# Patient Record
Sex: Female | Born: 1935 | Race: White | Hispanic: No | Smoking: Former smoker
Health system: Southern US, Community
[De-identification: ages and names within clinical notes are randomized; demographics above are authoritative.]

## PROBLEM LIST (undated history)

## (undated) DIAGNOSIS — H3530 Unspecified macular degeneration: Secondary | ICD-10-CM

## (undated) DIAGNOSIS — H409 Unspecified glaucoma: Secondary | ICD-10-CM

## (undated) DIAGNOSIS — E039 Hypothyroidism, unspecified: Secondary | ICD-10-CM

## (undated) DIAGNOSIS — M1990 Unspecified osteoarthritis, unspecified site: Secondary | ICD-10-CM

## (undated) DIAGNOSIS — I2510 Atherosclerotic heart disease of native coronary artery without angina pectoris: Secondary | ICD-10-CM

## (undated) DIAGNOSIS — Z87898 Personal history of other specified conditions: Secondary | ICD-10-CM

## (undated) HISTORY — PX: HEEL SPUR SURGERY: SHX665

## (undated) HISTORY — PX: ABDOMINAL HYSTERECTOMY: SHX81

## (undated) HISTORY — PX: EYE SURGERY: SHX253

## (undated) HISTORY — PX: HAMMER TOE SURGERY: SHX385

## (undated) HISTORY — PX: SHOULDER ARTHROSCOPY W/ ROTATOR CUFF REPAIR: SHX2400

---

## 2002-08-11 HISTORY — PX: STAPEDECTOMY: SHX2435

## 2005-03-04 ENCOUNTER — Ambulatory Visit: Payer: Self-pay | Admitting: Internal Medicine

## 2006-03-11 ENCOUNTER — Ambulatory Visit: Payer: Self-pay | Admitting: Internal Medicine

## 2006-11-14 ENCOUNTER — Other Ambulatory Visit: Payer: Self-pay

## 2006-11-14 ENCOUNTER — Emergency Department: Payer: Self-pay | Admitting: Emergency Medicine

## 2007-04-14 ENCOUNTER — Ambulatory Visit: Payer: Self-pay | Admitting: Internal Medicine

## 2008-06-06 ENCOUNTER — Ambulatory Visit: Payer: Self-pay | Admitting: Internal Medicine

## 2009-08-23 ENCOUNTER — Ambulatory Visit: Payer: Self-pay | Admitting: Internal Medicine

## 2010-10-01 ENCOUNTER — Ambulatory Visit: Payer: Self-pay | Admitting: Internal Medicine

## 2011-10-22 ENCOUNTER — Ambulatory Visit: Payer: Self-pay | Admitting: Internal Medicine

## 2012-03-02 ENCOUNTER — Ambulatory Visit: Payer: Self-pay | Admitting: Orthopedic Surgery

## 2012-05-18 ENCOUNTER — Encounter: Payer: Self-pay | Admitting: Orthopedic Surgery

## 2012-06-11 ENCOUNTER — Encounter: Payer: Self-pay | Admitting: Orthopedic Surgery

## 2012-07-11 ENCOUNTER — Encounter: Payer: Self-pay | Admitting: Orthopedic Surgery

## 2012-10-26 ENCOUNTER — Ambulatory Visit: Payer: Self-pay | Admitting: Internal Medicine

## 2013-01-26 ENCOUNTER — Ambulatory Visit: Payer: Self-pay | Admitting: Cardiology

## 2013-10-27 ENCOUNTER — Ambulatory Visit: Payer: Self-pay | Admitting: Family Medicine

## 2014-12-05 ENCOUNTER — Other Ambulatory Visit: Payer: Self-pay

## 2014-12-05 DIAGNOSIS — Z1231 Encounter for screening mammogram for malignant neoplasm of breast: Secondary | ICD-10-CM

## 2014-12-15 ENCOUNTER — Other Ambulatory Visit: Payer: Self-pay

## 2014-12-15 ENCOUNTER — Ambulatory Visit
Admission: RE | Admit: 2014-12-15 | Discharge: 2014-12-15 | Disposition: A | Discharge status: Home / Self Care | Payer: Medicare Other | Source: Ambulatory Visit | Attending: Family Medicine | Admitting: Family Medicine

## 2014-12-15 DIAGNOSIS — Z1231 Encounter for screening mammogram for malignant neoplasm of breast: Secondary | ICD-10-CM | POA: Insufficient documentation

## 2015-10-17 ENCOUNTER — Other Ambulatory Visit: Payer: Self-pay | Admitting: Student

## 2015-10-17 DIAGNOSIS — L02413 Cutaneous abscess of right upper limb: Secondary | ICD-10-CM

## 2015-11-06 ENCOUNTER — Ambulatory Visit
Admission: RE | Admit: 2015-11-06 | Discharge: 2015-11-06 | Disposition: A | Discharge status: Home / Self Care | Payer: Medicare Other | Source: Ambulatory Visit | Attending: Student | Admitting: Student

## 2015-11-06 DIAGNOSIS — Z9889 Other specified postprocedural states: Secondary | ICD-10-CM | POA: Insufficient documentation

## 2015-11-06 DIAGNOSIS — L02413 Cutaneous abscess of right upper limb: Secondary | ICD-10-CM | POA: Diagnosis not present

## 2015-11-06 DIAGNOSIS — M7989 Other specified soft tissue disorders: Secondary | ICD-10-CM | POA: Diagnosis not present

## 2015-11-14 ENCOUNTER — Other Ambulatory Visit: Payer: Medicare Other

## 2015-11-15 ENCOUNTER — Encounter: Admission: RE | Payer: Self-pay | Source: Ambulatory Visit

## 2015-11-15 ENCOUNTER — Ambulatory Visit: Admission: RE | Admit: 2015-11-15 | Payer: Medicare Other | Source: Ambulatory Visit | Admitting: Surgery

## 2015-11-15 SURGERY — REPAIR, ROTATOR CUFF, OPEN
Anesthesia: Choice | Laterality: Right

## 2015-11-28 ENCOUNTER — Other Ambulatory Visit: Payer: Self-pay | Admitting: Family Medicine

## 2015-11-28 DIAGNOSIS — Z1231 Encounter for screening mammogram for malignant neoplasm of breast: Secondary | ICD-10-CM

## 2015-11-30 ENCOUNTER — Encounter
Admission: RE | Admit: 2015-11-30 | Discharge: 2015-11-30 | Disposition: A | Discharge status: Home / Self Care | Payer: Medicare Other | Source: Ambulatory Visit | Attending: Surgery | Admitting: Surgery

## 2015-11-30 DIAGNOSIS — Z01810 Encounter for preprocedural cardiovascular examination: Secondary | ICD-10-CM | POA: Diagnosis present

## 2015-11-30 DIAGNOSIS — Z01812 Encounter for preprocedural laboratory examination: Secondary | ICD-10-CM | POA: Diagnosis present

## 2015-11-30 HISTORY — DX: Unspecified osteoarthritis, unspecified site: M19.90

## 2015-11-30 HISTORY — DX: Hypothyroidism, unspecified: E03.9

## 2015-11-30 HISTORY — DX: Personal history of other specified conditions: Z87.898

## 2015-11-30 HISTORY — DX: Unspecified glaucoma: H40.9

## 2015-11-30 HISTORY — DX: Unspecified macular degeneration: H35.30

## 2015-11-30 HISTORY — DX: Atherosclerotic heart disease of native coronary artery without angina pectoris: I25.10

## 2015-11-30 LAB — BASIC METABOLIC PANEL
Anion gap: 6 (ref 5–15)
BUN: 17 mg/dL (ref 6–20)
CO2: 29 mmol/L (ref 22–32)
Calcium: 9 mg/dL (ref 8.9–10.3)
Chloride: 104 mmol/L (ref 101–111)
Creatinine, Ser: 0.75 mg/dL (ref 0.44–1.00)
GFR calc Af Amer: >60 mL/min (ref >60)
GFR calc non Af Amer: >60 mL/min (ref >60)
Glucose, Bld: 82 mg/dL (ref 65–99)
Potassium: 3.9 mmol/L (ref 3.5–5.1)
Sodium: 139 mmol/L (ref 135–145)

## 2015-11-30 LAB — CBC
HCT: 37.4 % (ref 35.0–47.0)
Hemoglobin: 12.9 g/dL (ref 12.0–16.0)
MCH: 32 pg (ref 26.0–34.0)
MCHC: 34.5 g/dL (ref 32.0–36.0)
MCV: 92.9 fL (ref 80.0–100.0)
Platelets: 236 10*3/uL (ref 150–440)
RBC: 4.02 MIL/uL (ref 3.80–5.20)
RDW: 13.3 % (ref 11.5–14.5)
WBC: 6.2 10*3/uL (ref 3.6–11.0)

## 2015-11-30 NOTE — Pre-Procedure Instructions (Signed)
Cardiac clearance received from Dr. Darrold JunkerParaschos stating patient is at low risk for surgery.

## 2015-11-30 NOTE — Pre-Procedure Instructions (Signed)
Anesthesia Note   Result Impression   1. Negative ETT 2. Normal left ventricular function 3. Normal wall motion 4. No evidence for scar or ischemia   Result Narrative  Procedure: Exercise Myocardial Perfusion Imaging   ONE day procedure  Indication: Coronary artery disease involving native coronary artery of  native heart with angina pectoris - Plan: NM myocardial perfusion SPECT  multiple (stress and rest), ECG stress test only  Chest pain with high risk for cardiac etiology - Plan: NM myocardial  perfusion SPECT multiple (stress and rest), ECG stress test only Ordering Physician:   Dr. Marcina MillardAlexander Paraschos   Clinical History: [age] y.o. year old female Vitals: Height: 61 in  Weight: 155 lb Cardiac risk factors include:    Hyperlipidemia, HTN and CAD    Procedure: The patient performed treadmill exercise using a Bruce protocol for 3:44  minutes. The exercise test was stopped due to DYSPNEA/fatigue.  Blood  pressure response was normal.   Rest HR: 74bpm Rest BP: 120/1568mmHg Max HR: 137bpm Max BP: 172/10870mmHg Mets:     6.30 % MAX HR:   100%  Stress Test Administered by: Percell BostonINA WALLACE, CMA  ECG Interpretation: Rest ECG:  normal sinus rhythm, none Stress ECG:  normal sinus rhythm,  Recovery ECG:  normal sinus rhythm ECG Interpretation:  negative, no ECG changes.   Administrations This Visit    technetium Tc3359m sestamibi (CARDIOLITE) injection 14.1 millicurie    Administered Action Dose Route Administered By      05/17/2014 Given 14.1 millicurie Intravenous Lennox Soldersebecca Truhe, CNMT            technetium Tc6459m sestamibi (CARDIOLITE) injection 35.2 millicurie    Administered Action Dose Route Administered By      05/17/2014 Given 35.2 millicurie Intravenous Lennox Soldersebecca Truhe, CNMT              Gated post-stress perfusion imaging was performed 30 minutes after stress.  Rest images were performed 30 minutes after injection.  Gated LV Analysis:  TID:     LVEF=74%  FINDINGS: Regional wall motion:  reveals normal myocardial thickening and wall  motion. The overall quality of the study is good.   Artifacts noted: no Left ventricular cavity: normal.  Perfusion Analysis:  SPECT images demonstrate homogeneous tracer  distribution throughout the myocardium.     Status Results Details   Encounter Summary  ECG [facility]02[facility]  ECG stress test only10/02/2014  [facility]  Component Name Value Range       Status Results Details   Encounter Summary

## 2015-11-30 NOTE — Patient Instructions (Signed)
  Your procedure is scheduled on:December 06, 2015 (Thursday) Report to Day Surgery.[facility](Medical Mall) Second Floor To find out your arrival time please call [phone] between 1PM - 3PM on December 05, 2015 (Wednesday).  Remember: Instructions that are not followed completely may result in serious medical risk, up to and including death, or upon the discretion of your surgeon and anesthesiologist your surgery may need to be rescheduled.    __x__ 1. Do not eat food or drink liquids after midnight. No gum chewing or hard candies.     __x__ 2. No Alcohol for 24 hours before or after surgery.   ____ 3. Bring all medications with you on the day of surgery if instructed.    __x__ 4. Notify your doctor if there is any change in your medical condition     (cold, fever, infections).     Do not wear jewelry, make-up, hairpins, clips or nail polish.  Do not wear lotions, powders, or perfumes. You may wear deodorant.  Do not shave 48 hours prior to surgery. Men may shave face and neck.  Do not bring valuables to the hospital.    [facility] is not responsible for any belongings or valuables.               Contacts, dentures or bridgework may not be worn into surgery.  Leave your suitcase in the car. After surgery it may be brought to your room.  For patients admitted to the hospital, discharge time is determined by your                treatment team.   Patients discharged the day of surgery will not be allowed to drive home.   Please read over the following fact sheets that you were given:   Surgical Site Infection Prevention   ____ Take these medicines the morning of surgery with A SIP OF WATER:    1.   2.   3.   4.  5.  6.  ____ Fleet Enema (as directed)   _x___ Use CHG Soap as directed  ____ Use inhalers on the day of surgery  ____ Stop metformin 2 days prior to surgery    ____ Take 1/2 of usual insulin dose the night before surgery and none on the morning of surgery.   __x__ Stop  Coumadin/Plavix/aspirin on (STOP ASPIRIN NOW)  __x__ Stop Anti-inflammatories on (NO NSAIDS)   __x__ Stop supplements until after surgery.  (STOP AREDS 2 NOW)  ____ Bring C-Pap to the hospital.

## 2015-12-06 ENCOUNTER — Encounter
Admission: RE | Disposition: A | Discharge status: Home / Self Care | Payer: Self-pay | Source: Ambulatory Visit | Attending: Surgery

## 2015-12-06 ENCOUNTER — Ambulatory Visit
Admission: RE | Admit: 2015-12-06 | Discharge: 2015-12-06 | Disposition: A | Discharge status: Home / Self Care | Payer: Medicare Other | Source: Ambulatory Visit | Attending: Surgery | Admitting: Surgery

## 2015-12-06 ENCOUNTER — Ambulatory Visit: Payer: Medicare Other | Admitting: Anesthesiology

## 2015-12-06 ENCOUNTER — Encounter: Payer: Self-pay | Admitting: Anesthesiology

## 2015-12-06 DIAGNOSIS — E039 Hypothyroidism, unspecified: Secondary | ICD-10-CM | POA: Diagnosis not present

## 2015-12-06 DIAGNOSIS — J302 Other seasonal allergic rhinitis: Secondary | ICD-10-CM | POA: Diagnosis not present

## 2015-12-06 DIAGNOSIS — E785 Hyperlipidemia, unspecified: Secondary | ICD-10-CM | POA: Diagnosis not present

## 2015-12-06 DIAGNOSIS — I10 Essential (primary) hypertension: Secondary | ICD-10-CM | POA: Diagnosis not present

## 2015-12-06 DIAGNOSIS — Z7982 Long term (current) use of aspirin: Secondary | ICD-10-CM | POA: Diagnosis not present

## 2015-12-06 DIAGNOSIS — Z79899 Other long term (current) drug therapy: Secondary | ICD-10-CM | POA: Diagnosis not present

## 2015-12-06 DIAGNOSIS — I2510 Atherosclerotic heart disease of native coronary artery without angina pectoris: Secondary | ICD-10-CM | POA: Insufficient documentation

## 2015-12-06 DIAGNOSIS — Z9842 Cataract extraction status, left eye: Secondary | ICD-10-CM | POA: Insufficient documentation

## 2015-12-06 DIAGNOSIS — Z8249 Family history of ischemic heart disease and other diseases of the circulatory system: Secondary | ICD-10-CM | POA: Diagnosis not present

## 2015-12-06 DIAGNOSIS — Z87891 Personal history of nicotine dependence: Secondary | ICD-10-CM | POA: Diagnosis not present

## 2015-12-06 DIAGNOSIS — Z9841 Cataract extraction status, right eye: Secondary | ICD-10-CM | POA: Diagnosis not present

## 2015-12-06 DIAGNOSIS — Z90710 Acquired absence of both cervix and uterus: Secondary | ICD-10-CM | POA: Diagnosis not present

## 2015-12-06 DIAGNOSIS — M795 Residual foreign body in soft tissue: Secondary | ICD-10-CM | POA: Diagnosis not present

## 2015-12-06 DIAGNOSIS — Z791 Long term (current) use of non-steroidal anti-inflammatories (NSAID): Secondary | ICD-10-CM | POA: Insufficient documentation

## 2015-12-06 DIAGNOSIS — L02413 Cutaneous abscess of right upper limb: Secondary | ICD-10-CM | POA: Insufficient documentation

## 2015-12-06 DIAGNOSIS — Z820 Family history of epilepsy and other diseases of the nervous system: Secondary | ICD-10-CM | POA: Insufficient documentation

## 2015-12-06 DIAGNOSIS — Z9889 Other specified postprocedural states: Secondary | ICD-10-CM | POA: Diagnosis not present

## 2015-12-06 DIAGNOSIS — Z888 Allergy status to other drugs, medicaments and biological substances status: Secondary | ICD-10-CM | POA: Diagnosis not present

## 2015-12-06 DIAGNOSIS — H409 Unspecified glaucoma: Secondary | ICD-10-CM | POA: Insufficient documentation

## 2015-12-06 HISTORY — PX: SHOULDER OPEN ROTATOR CUFF REPAIR: SHX2407

## 2015-12-06 HISTORY — PX: IRRIGATION AND DEBRIDEMENT SHOULDER: SHX5880

## 2015-12-06 SURGERY — REPAIR, ROTATOR CUFF, OPEN
Anesthesia: General | Site: Shoulder | Laterality: Right | Wound class: Contaminated

## 2015-12-06 MED ORDER — PHENYLEPHRINE HCL 10 MG/ML IJ SOLN
INTRAMUSCULAR | Status: DC | PRN
  Administered 2015-12-06 (×2): 100 ug via INTRAVENOUS
  Administered 2015-12-06: 200 ug via INTRAVENOUS
  Administered 2015-12-06: 100 ug via INTRAVENOUS
  Administered 2015-12-06: 200 ug via INTRAVENOUS

## 2015-12-06 MED ORDER — LACTATED RINGERS IV SOLN
INTRAVENOUS | Status: DC
  Administered 2015-12-06: 07:00:00 via INTRAVENOUS

## 2015-12-06 MED ORDER — FENTANYL CITRATE (PF) 100 MCG/2ML IJ SOLN
INTRAMUSCULAR | Status: DC | PRN
  Administered 2015-12-06: 100 ug via INTRAVENOUS

## 2015-12-06 MED ORDER — HYDROCODONE-ACETAMINOPHEN 5-325 MG PO TABS: 1.0000 | ORAL_TABLET | Freq: Four times a day (QID) | ORAL | Status: DC | PRN

## 2015-12-06 MED ORDER — OXYCODONE HCL 5 MG/5ML PO SOLN: 5.0000 mg | Freq: Once | ORAL | Status: DC | PRN

## 2015-12-06 MED ORDER — OXYCODONE HCL 5 MG PO TABS: 5.0000 mg | ORAL_TABLET | Freq: Once | ORAL | Status: DC | PRN

## 2015-12-06 MED ORDER — BUPIVACAINE-EPINEPHRINE (PF) 0.5% -1:200000 IJ SOLN
INTRAMUSCULAR | Status: AC
  Filled 2015-12-06: qty 30

## 2015-12-06 MED ORDER — SUGAMMADEX SODIUM 200 MG/2ML IV SOLN
INTRAVENOUS | Status: DC | PRN
  Administered 2015-12-06: 129.8 mg via INTRAVENOUS

## 2015-12-06 MED ORDER — LIDOCAINE HCL (CARDIAC) 20 MG/ML IV SOLN
INTRAVENOUS | Status: DC | PRN
  Administered 2015-12-06: 60 mg via INTRAVENOUS

## 2015-12-06 MED ORDER — ACETAMINOPHEN 10 MG/ML IV SOLN
INTRAVENOUS | Status: DC | PRN
  Administered 2015-12-06: 1000 mg via INTRAVENOUS

## 2015-12-06 MED ORDER — DEXAMETHASONE SODIUM PHOSPHATE 10 MG/ML IJ SOLN
INTRAMUSCULAR | Status: DC | PRN
  Administered 2015-12-06: 4 mg via INTRAVENOUS

## 2015-12-06 MED ORDER — PROPOFOL 10 MG/ML IV BOLUS
INTRAVENOUS | Status: DC | PRN
  Administered 2015-12-06: 90 mg via INTRAVENOUS

## 2015-12-06 MED ORDER — GLYCOPYRROLATE 0.2 MG/ML IJ SOLN
INTRAMUSCULAR | Status: DC | PRN
  Administered 2015-12-06: 0.2 mg via INTRAVENOUS

## 2015-12-06 MED ORDER — CEFAZOLIN SODIUM-DEXTROSE 2-4 GM/100ML-% IV SOLN
INTRAVENOUS | Status: DC
Start: 2015-12-06 — End: 2015-12-06
  Filled 2015-12-06: qty 100

## 2015-12-06 MED ORDER — ACETAMINOPHEN 10 MG/ML IV SOLN
INTRAVENOUS | Status: AC
  Filled 2015-12-06: qty 100

## 2015-12-06 MED ORDER — CEFAZOLIN SODIUM-DEXTROSE 2-4 GM/100ML-% IV SOLN
2.0000 g | Freq: Once | INTRAVENOUS | Status: AC
  Administered 2015-12-06: 2 g via INTRAVENOUS

## 2015-12-06 MED ORDER — FAMOTIDINE 20 MG PO TABS
20.0000 mg | ORAL_TABLET | Freq: Once | ORAL | Status: AC
  Administered 2015-12-06: 20 mg via ORAL

## 2015-12-06 MED ORDER — BUPIVACAINE-EPINEPHRINE (PF) 0.5% -1:200000 IJ SOLN
INTRAMUSCULAR | Status: DC | PRN
  Administered 2015-12-06: 30 mL

## 2015-12-06 MED ORDER — ROCURONIUM BROMIDE 100 MG/10ML IV SOLN
INTRAVENOUS | Status: DC | PRN
  Administered 2015-12-06: 30 mg via INTRAVENOUS

## 2015-12-06 MED ORDER — FAMOTIDINE 20 MG PO TABS
ORAL_TABLET | ORAL | Status: AC
  Filled 2015-12-06: qty 1

## 2015-12-06 MED ORDER — ONDANSETRON HCL 4 MG/2ML IJ SOLN
INTRAMUSCULAR | Status: DC | PRN
  Administered 2015-12-06: 4 mg via INTRAVENOUS

## 2015-12-06 MED ORDER — FENTANYL CITRATE (PF) 100 MCG/2ML IJ SOLN: 25.0000 ug | INTRAMUSCULAR | Status: DC | PRN

## 2015-12-06 SURGICAL SUPPLY — 50 items
BIT DRILL JUGRKNT W/NDL BIT2.9 (DRILL) IMPLANT
BLADE FULL RADIUS 3.5 (BLADE) IMPLANT
BLADE SHAVER 4.5X7 STR FR (MISCELLANEOUS) IMPLANT
BUR ACROMIONIZER 4.0 (BURR) IMPLANT
BUR BR 5.5 WIDE MOUTH (BURR) IMPLANT
CANNULA 8.5X75 THRED (CANNULA) IMPLANT
CANNULA SHAVER 8MMX76MM (CANNULA) IMPLANT
CHLORAPREP W/TINT 26ML (MISCELLANEOUS) ×2 IMPLANT
DRAPE IMP U-DRAPE 54X76 (DRAPES) ×4 IMPLANT
DRAPE SURG 17X11 SM STRL (DRAPES) ×2 IMPLANT
DRILL JUGGERKNOT W/NDL BIT 2.9 (DRILL)
DRSG OPSITE POSTOP 4X8 (GAUZE/BANDAGES/DRESSINGS) ×2 IMPLANT
ELECT REM PT RETURN 9FT ADLT (ELECTROSURGICAL) ×2
ELECTRODE REM PT RTRN 9FT ADLT (ELECTROSURGICAL) ×1 IMPLANT
GAUZE PETRO XEROFOAM 1X8 (MISCELLANEOUS) ×2 IMPLANT
GAUZE SPONGE 4X4 12PLY STRL (GAUZE/BANDAGES/DRESSINGS) ×2 IMPLANT
GLOVE BIO SURGEON STRL SZ7.5 (GLOVE) ×4 IMPLANT
GLOVE BIO SURGEON STRL SZ8 (GLOVE) ×4 IMPLANT
GLOVE BIOGEL PI IND STRL 8 (GLOVE) ×1 IMPLANT
GLOVE BIOGEL PI INDICATOR 8 (GLOVE) ×1
GLOVE INDICATOR 8.0 STRL GRN (GLOVE) ×2 IMPLANT
GOWN STRL REUS W/ TWL LRG LVL3 (GOWN DISPOSABLE) ×2 IMPLANT
GOWN STRL REUS W/ TWL XL LVL3 (GOWN DISPOSABLE) ×1 IMPLANT
GOWN STRL REUS W/TWL LRG LVL3 (GOWN DISPOSABLE) ×2
GOWN STRL REUS W/TWL XL LVL3 (GOWN DISPOSABLE) ×1
GRASPER SUT 15 45D LOW PRO (SUTURE) IMPLANT
IV LACTATED RINGER IRRG 3000ML (IV SOLUTION)
IV LR IRRIG 3000ML ARTHROMATIC (IV SOLUTION) IMPLANT
MANIFOLD NEPTUNE II (INSTRUMENTS) IMPLANT
MASK FACE SPIDER DISP (MASK) ×2 IMPLANT
MAT BLUE FLOOR 46X72 FLO (MISCELLANEOUS) IMPLANT
NDL MAYO CATGUT SZ4 (NEEDLE) IMPLANT
NEEDLE HYPO 22GX1.5 SAFETY (NEEDLE) ×2 IMPLANT
NEEDLE MAYO 6 CRC TAPER PT (NEEDLE) IMPLANT
NEEDLE MAYO CATGUT SZ 1.5 (NEEDLE)
NEEDLE MAYO CATGUT SZ 2 (NEEDLE) IMPLANT
NEEDLE REVERSE CUT 1/2 CRC (NEEDLE) IMPLANT
PACK ARTHROSCOPY SHOULDER (MISCELLANEOUS) ×2 IMPLANT
SLING ARM LRG DEEP (SOFTGOODS) IMPLANT
SLING ULTRA II LG (MISCELLANEOUS) IMPLANT
STAPLER SKIN PROX 35W (STAPLE) IMPLANT
STRAP SAFETY BODY (MISCELLANEOUS) ×2 IMPLANT
SUT ETHIBOND 0 MO6 C/R (SUTURE) IMPLANT
SUT PROLENE 4 0 PS 2 18 (SUTURE) IMPLANT
SUT VIC AB 2-0 CT1 27 (SUTURE)
SUT VIC AB 2-0 CT1 TAPERPNT 27 (SUTURE) IMPLANT
TAPE MICROFOAM 4IN (TAPE) ×2 IMPLANT
TUBING ARTHRO INFLOW-ONLY STRL (TUBING) IMPLANT
TUBING CONNECTING 10 (TUBING) IMPLANT
WAND HAND CNTRL MULTIVAC 90 (MISCELLANEOUS) IMPLANT

## 2015-12-06 NOTE — Transfer of Care (Signed)
Immediate Anesthesia Transfer of Care Note  Patient: Melissa [name]  Procedure(s) Performed: Procedure(s): ROTATOR CUFF REPAIR SHOULDER OPEN (Right) IRRIGATION AND DEBRIDEMENT SHOULDER (Right)  Patient Location: PACU  Anesthesia Type:General  Level of Consciousness: awake, alert  and responds to stimulation  Airway & Oxygen Therapy: Patient Spontanous Breathing and Patient connected to face mask oxygen  Post-op Assessment: Report given to RN and Post -op Vital signs reviewed and stable  Post vital signs: Reviewed and stable  Last Vitals:  Filed Vitals:   12/06/15 0833 12/06/15 0835  BP: 139/55 139/55  Pulse: 78 73  Temp: 36.1 C   Resp: 16 10    Last Pain: There were no vitals filed for this visit.       Complications: No apparent anesthesia complications

## 2015-12-06 NOTE — H&P (Signed)
Paper H&P to be scanned into permanent record. H&P reviewed. No changes. 

## 2015-12-06 NOTE — Anesthesia Procedure Notes (Signed)
Procedure Name: Intubation Performed by: Casey BurkittHOANG, Donald Jacque Pre-anesthesia Checklist: Emergency Drugs available, Patient identified, Suction available, Patient being monitored and Timeout performed Patient Re-evaluated:Patient Re-evaluated prior to inductionOxygen Delivery Method: Circle system utilized Preoxygenation: Pre-oxygenation with 100% oxygen Intubation Type: IV induction Ventilation: Mask ventilation without difficulty Laryngoscope Size: Mac and 3 Grade View: Grade II Tube type: Oral Tube size: 7.0 mm Number of attempts: 1 Airway Equipment and Method: Stylet Placement Confirmation: ETT inserted through vocal cords under direct vision,  breath sounds checked- equal and bilateral and positive ETCO2 Secured at: 21 cm Tube secured with: Tape Dental Injury: Teeth and Oropharynx as per pre-operative assessment

## 2015-12-06 NOTE — Op Note (Signed)
12/06/2015  8:35 AM  Patient:   Melissa [name]  Pre-Op Diagnosis:   Subcutaneous abscess status post prior open rotator cuff repair, right shoulder.  Postoperative diagnosis: Same.  Procedure: Irrigation and debridement of subcutaneous abscess with removal of retained nonabsorbable sutures, right shoulder.  Anesthesia: General endotracheal.  Surgeon:   Maryagnes AmosJ. Jeffrey Poggi, MD  Assistant:   None  Findings: As above. The area of infection did not go deep to the deltoid. Three large nonabsorbable sutures which had been placed to reattach the deltoid to the acromion were identified and removed.  Complications: None  Fluids:   600 cc  Estimated blood loss: 5 cc  Tourniquet time: None  Drains: None  Closure: Staples   Brief clinical note: The patient is an 82 year old female who had undergone an open rotator cuff repair in 1994. Several months ago, she began to develop drainage from the shoulder. She was treated by another physician with several courses of by mouth antibiotics and several attempts at limited I&D in the office, but her symptoms have persisted. An MRI scan was obtained which demonstrated a subcutaneous abscess with an intact rotator cuff repair, so she was referred to me for definitive management of these shoulder symptoms.  Procedure: The patient was brought into the operating room and lain in the supine position. After adequate general endotracheal intubation and anesthesia were obtained, the patient was repositioned in the beach chair position using the beach chair positioner. The right shoulder and upper extremity were prepped with ChloraPrep solution before being draped sterilely. Preoperative antibiotics were administered. A timeout was performed to confirm the proper surgical site before the area of drainage was ellipsed. The incision was carried down through the subcutaneous tissues to the deltoid fascia. Cultures were obtained at this point.  Three nonabsorbable suture knots are identified and removed in their entirety. The underlying deltoid repair was intact. Aggressive debridement of the subcutaneous tissues was performed sharply before hemostasis was achieved using electrocautery. The removed tissues were sent to pathology.  The wound was copiously irrigated with sterile saline solution before the subcutaneous tissues were reapproximated using 2-0 Vicryl interrupted sutures. The skin was closed using staples before the peri-incisional tissues were infiltrated with 15 cc of 0.5% Sensorcaine with epinephrine. A sterile occlusive dressing was applied to the shoulder before the arm was placed into a shoulder immobilizer. The patient was then awakened, extubated, and returned to the recovery room in satisfactory condition after tolerating the procedure well.

## 2015-12-06 NOTE — Anesthesia Postprocedure Evaluation (Signed)
Anesthesia Post Note  Patient: Melissa [name]  Procedure(s) Performed: Procedure(s) (LRB): ROTATOR CUFF REPAIR SHOULDER OPEN (Right) IRRIGATION AND DEBRIDEMENT SHOULDER (Right)  Patient location during evaluation: PACU Anesthesia Type: General Level of consciousness: awake and alert Pain management: pain level controlled Vital Signs Assessment: post-procedure vital signs reviewed and stable Respiratory status: spontaneous breathing, nonlabored ventilation, respiratory function stable and patient connected to nasal cannula oxygen Cardiovascular status: blood pressure returned to baseline and stable Postop Assessment: no signs of nausea or vomiting Anesthetic complications: no    Last Vitals:  Filed Vitals:   12/06/15 0934 12/06/15 1000  BP: 142/59 136/58  Pulse: 63 63  Temp: 36 C   Resp: 14 14    Last Pain:  Filed Vitals:   12/06/15 1015  PainSc: 0-No pain                 Cleda MccreedyJoseph K Demico Ploch

## 2015-12-06 NOTE — Anesthesia Preprocedure Evaluation (Signed)
Anesthesia Evaluation  Patient identified by MRN, date of birth, ID band Patient awake    Reviewed: Allergy & Precautions, H&P , NPO status , Patient's Chart, lab work & pertinent test results  History of Anesthesia Complications Negative for: history of anesthetic complications  Airway Mallampati: III  TM Distance: >3 FB Neck ROM: limited    Dental  (+) Poor Dentition   Pulmonary neg shortness of breath, former smoker,    Pulmonary exam normal breath sounds clear to auscultation       Cardiovascular Exercise Tolerance: Good (-) angina+ CAD  (-) Past MI and (-) DOE Normal cardiovascular exam Rhythm:regular Rate:Normal     Neuro/Psych negative neurological ROS  negative psych ROS   GI/Hepatic negative GI ROS, Neg liver ROS, neg GERD  ,  Endo/Other  Hypothyroidism   Renal/GU negative Renal ROS  negative genitourinary   Musculoskeletal  (+) Arthritis ,   Abdominal   Peds  Hematology negative hematology ROS (+)   Anesthesia Other Findings Past Medical History:   Coronary artery disease                                      Hx of urinary frequency                                      Arthritis                                                    Glaucoma (increased eye pressure)                            Macular degeneration of both eyes                            Hypothyroidism                                              Past Surgical History:   ABDOMINAL HYSTERECTOMY                                        EYE SURGERY                                     Bilateral                Comment:Cataract Extraction with IOL   HEEL SPUR SURGERY                               Bilateral 1985, 1993     Comment:[facility]   SHOULDER ARTHROSCOPY W/ ROTATOR CUFF REPAIR     Right                Comment:Dr. Gerrit Heckaliff, [facility]  STAPEDECTOMY                                    Right 2004           Comment:Dr. Jenne Campus, [facility]  HAMMER TOE SURGERY                              Right                Comment:[facility]  BMI    Body Mass Index   27.03 kg/m 2      Reproductive/Obstetrics negative OB ROS                             Anesthesia Physical Anesthesia Plan  ASA: III  Anesthesia Plan: General ETT   Post-op Pain Management:    Induction:   Airway Management Planned:   Additional Equipment:   Intra-op Plan:   Post-operative Plan:   Informed Consent: I have reviewed the patients History and Physical, chart, labs and discussed the procedure including the risks, benefits and alternatives for the proposed anesthesia with the patient or authorized representative who has indicated his/her understanding and acceptance.   Dental Advisory Given  Plan Discussed with: Anesthesiologist, CRNA and Surgeon  Anesthesia Plan Comments: (Patient has abscess on operative shoulder with weakness in that arm as well.)        Anesthesia Quick Evaluation

## 2015-12-06 NOTE — Discharge Instructions (Addendum)
May shower with intact op-site dressing. Use sling as necessary for comfort. Apply ice to affected area frequently. Return for follow-up in 10-14 days or as scheduled.   AMBULATORY SURGERY  DISCHARGE INSTRUCTIONS   1) The drugs that you were given will stay in your system until tomorrow so for the next 24 hours you should not:  A) Drive an automobile B) Make any legal decisions C) Drink any alcoholic beverage   2) You may resume regular meals tomorrow.  Today it is better to start with liquids and gradually work up to solid foods.  You may eat anything you prefer, but it is better to start with liquids, then soup and crackers, and gradually work up to solid foods.   3) Please notify your doctor immediately if you have any unusual bleeding, trouble breathing, redness and pain at the surgery site, drainage, fever, or pain not relieved by medication.    4) Additional Instructions:        Please contact your physician with any problems or Same Day Surgery at [phone], Monday through Friday 6 am to 4 pm, or [place] [facility] Main number at [phone].

## 2015-12-09 LAB — ANAEROBIC CULTURE

## 2015-12-10 LAB — TISSUE CULTURE: Culture: NO GROWTH

## 2015-12-10 LAB — WOUND CULTURE: Culture: NO GROWTH

## 2015-12-21 ENCOUNTER — Ambulatory Visit
Admission: RE | Admit: 2015-12-21 | Discharge: 2015-12-21 | Disposition: A | Discharge status: Home / Self Care | Payer: Medicare Other | Source: Ambulatory Visit | Attending: Family Medicine | Admitting: Family Medicine

## 2015-12-21 ENCOUNTER — Other Ambulatory Visit: Payer: Self-pay | Admitting: Family Medicine

## 2015-12-21 DIAGNOSIS — Z1231 Encounter for screening mammogram for malignant neoplasm of breast: Secondary | ICD-10-CM | POA: Diagnosis present

## 2016-09-26 ENCOUNTER — Ambulatory Visit (INDEPENDENT_AMBULATORY_CARE_PROVIDER_SITE_OTHER): Payer: Medicare HMO | Admitting: Podiatry

## 2016-09-26 VITALS — BP 123/74 | HR 71 | Resp 16

## 2016-09-26 DIAGNOSIS — L84 Corns and callosities: Secondary | ICD-10-CM

## 2016-09-26 DIAGNOSIS — L851 Acquired keratosis [keratoderma] palmaris et plantaris: Secondary | ICD-10-CM

## 2016-09-26 NOTE — Progress Notes (Signed)
   Subjective:    Patient ID: Melissa [name], female    DOB: 09/04/1927, 83 y.o.   MRN: [id number]  HPI    Review of Systems  All other systems reviewed and are negative.      Objective:   Physical Exam        Assessment & Plan:

## 2016-09-26 NOTE — Progress Notes (Signed)
Patient ID: Melissa GeneralMargaret N [name], female   DOB: 1927-08-22, 83 y.o.   MRN: [id number]   Subjective:  Patient presents today for evaluation of left foot irritation. Patient went to a previous podiatrist and she believes that she possibly has tape on the bottom of her left foot. Patient presents today to determine whether or not she has tape on the bottom of her left foot Patient is a history of hammertoe surgery on the second digit left foot.    Objective/Physical Exam [name]: The patient is alert and oriented x3 in no acute distress.  Dermatology: Hyperkeratotic callus lesions noted to the plantar aspect of the left foot. Non-symptomatic. Skin is warm, dry and supple bilateral lower extremities. Negative for open lesions or macerations.  Vascular: Palpable pedal pulses bilaterally. No edema or erythema noted. Capillary refill within normal limits.  Neurological: Epicritic and protective threshold grossly intact bilaterally.   Musculoskeletal Exam: Range of motion within normal limits to all pedal and ankle joints bilateral. Muscle strength 5/5 in all groups bilateral.    Assessment: #1 callus plantar aspect of the left foot #2 history of hammertoe surgery second digit left foot   Plan of Care:  #1 Patient was evaluated. #2 inform the patient that she does not have tape on the bottom of her left foot. #3 today we discussed the callus formation on the plantar aspect of her left foot. Calluses are nonsymptomatic. #4 patient also gets random foot pain regarding her surgery to the second digit hammertoe. Recommend offload padding. Today pads were dispensed #5 return to clinic when necessary   Felecia ShellingBrent M. Krystle Polcyn, DPM [facility]  Dr. Felecia ShellingBrent M. Inita Uram, DPM    [address]                                        [place], [place] [address]                Office [phone]  Fax [phone]

## 2016-11-20 ENCOUNTER — Other Ambulatory Visit: Payer: Self-pay | Admitting: Family Medicine

## 2016-11-20 DIAGNOSIS — Z1231 Encounter for screening mammogram for malignant neoplasm of breast: Secondary | ICD-10-CM

## 2016-12-23 ENCOUNTER — Ambulatory Visit
Admission: RE | Admit: 2016-12-23 | Discharge: 2016-12-23 | Disposition: A | Discharge status: Home / Self Care | Payer: Medicare HMO | Source: Ambulatory Visit | Attending: Family Medicine | Admitting: Family Medicine

## 2016-12-23 DIAGNOSIS — Z1231 Encounter for screening mammogram for malignant neoplasm of breast: Secondary | ICD-10-CM | POA: Diagnosis not present

## 2017-11-02 ENCOUNTER — Emergency Department
Admission: EM | Admit: 2017-11-02 | Discharge: 2017-11-02 | Disposition: A | Discharge status: Home / Self Care | Payer: Medicare HMO | Attending: Emergency Medicine | Admitting: Emergency Medicine

## 2017-11-02 ENCOUNTER — Encounter: Payer: Self-pay | Admitting: Emergency Medicine

## 2017-11-02 ENCOUNTER — Emergency Department: Payer: Medicare HMO

## 2017-11-02 ENCOUNTER — Other Ambulatory Visit: Payer: Self-pay

## 2017-11-02 DIAGNOSIS — E039 Hypothyroidism, unspecified: Secondary | ICD-10-CM | POA: Diagnosis not present

## 2017-11-02 DIAGNOSIS — R040 Epistaxis: Secondary | ICD-10-CM | POA: Diagnosis not present

## 2017-11-02 DIAGNOSIS — Z7982 Long term (current) use of aspirin: Secondary | ICD-10-CM | POA: Insufficient documentation

## 2017-11-02 DIAGNOSIS — I2510 Atherosclerotic heart disease of native coronary artery without angina pectoris: Secondary | ICD-10-CM | POA: Diagnosis not present

## 2017-11-02 DIAGNOSIS — W19XXXA Unspecified fall, initial encounter: Secondary | ICD-10-CM

## 2017-11-02 DIAGNOSIS — Z87891 Personal history of nicotine dependence: Secondary | ICD-10-CM | POA: Diagnosis not present

## 2017-11-02 DIAGNOSIS — S0121XA Laceration without foreign body of nose, initial encounter: Secondary | ICD-10-CM

## 2017-11-02 DIAGNOSIS — Y999 Unspecified external cause status: Secondary | ICD-10-CM | POA: Insufficient documentation

## 2017-11-02 DIAGNOSIS — S0083XA Contusion of other part of head, initial encounter: Secondary | ICD-10-CM

## 2017-11-02 DIAGNOSIS — Z79899 Other long term (current) drug therapy: Secondary | ICD-10-CM | POA: Diagnosis not present

## 2017-11-02 DIAGNOSIS — Y92009 Unspecified place in unspecified non-institutional (private) residence as the place of occurrence of the external cause: Secondary | ICD-10-CM

## 2017-11-02 DIAGNOSIS — S0990XA Unspecified injury of head, initial encounter: Secondary | ICD-10-CM | POA: Diagnosis present

## 2017-11-02 DIAGNOSIS — W0110XA Fall on same level from slipping, tripping and stumbling with subsequent striking against unspecified object, initial encounter: Secondary | ICD-10-CM | POA: Insufficient documentation

## 2017-11-02 DIAGNOSIS — Y92002 Bathroom of unspecified non-institutional (private) residence single-family (private) house as the place of occurrence of the external cause: Secondary | ICD-10-CM | POA: Insufficient documentation

## 2017-11-02 DIAGNOSIS — Y9301 Activity, walking, marching and hiking: Secondary | ICD-10-CM | POA: Insufficient documentation

## 2017-11-02 NOTE — ED Provider Notes (Signed)
[facility] Emergency Department Provider Note   ____________________________________________   First MD Initiated Contact with Patient 11/02/17 1248     (approximate)  I have reviewed the triage vital signs and the nursing notes.   HISTORY  Chief Complaint Fall and Facial Pain    HPI Melissa [name] is a 84 y.o. female patient complained of headache and facial pain secondary to a trip and fall this morning.  Patient foot became tangled in bed spread.  She fell hitting her face on the floor.  Patient denies headache.  Patient complain of nasal pain and nosebleed.  Patient said nosebleed resolved just as she arrived to triage.  Patient denies LOC.  Patient denies vision disturbance or vertigo.   Past Medical History:  Diagnosis Date  . Arthritis   . Coronary artery disease   . Glaucoma (increased eye pressure)   . Hx of urinary frequency   . Hypothyroidism   . Macular degeneration of both eyes     There are no active problems to display for this patient.   Past Surgical History:  Procedure Laterality Date  . ABDOMINAL HYSTERECTOMY    . EYE SURGERY Bilateral    Cataract Extraction with IOL  . HAMMER TOE SURGERY Right    [facility]  . HEEL SPUR SURGERY Bilateral 1985, 1993   [facility]  . IRRIGATION AND DEBRIDEMENT SHOULDER Right 12/06/2015   Procedure: IRRIGATION AND DEBRIDEMENT SHOULDER;  Surgeon: Christena FlakeJohn J Poggi, MD;  Location: [facility] ORS;  Service: Orthopedics;  Laterality: Right;  . SHOULDER ARTHROSCOPY W/ ROTATOR CUFF REPAIR Right    [facility]  . SHOULDER OPEN ROTATOR CUFF REPAIR Right 12/06/2015   Procedure: ROTATOR CUFF REPAIR SHOULDER OPEN;  Surgeon: Christena FlakeJohn J Poggi, MD;  Location: [facility] ORS;  Service: Orthopedics;  Laterality: Right;  . STAPEDECTOMY Right 2004   [facility] Jen[facility]Queen, [facility]    Prior to Admission medications   Medication Sig Start Date End Date Taking? Authorizing Provider  aspirin EC 81 MG tablet Take 81 mg by  mouth daily.    [provider]  calcium-vitamin D (OSCAL WITH D) 500-200 MG-UNIT tablet Take 1 tablet by mouth every morning.    [provider]  ipratropium (ATROVENT) 0.03 % nasal spray Place 2 sprays into both nostrils 3 (three) times daily. Reported on 12/06/2015    [provider]  latanoprost (XALATAN) 0.005 % ophthalmic solution Place 1 drop into both eyes at bedtime.    [provider]  levothyroxine (SYNTHROID, LEVOTHROID) 75 MCG tablet Take 75 mcg by mouth daily before breakfast.    [provider]  Multiple Vitamins-Minerals (CENTRUM SILVER PO) Take 1 tablet by mouth every morning.    [provider]  Multiple Vitamins-Minerals (PRESERVISION AREDS 2) CAPS Take 1 capsule by mouth 2 (two) times daily.    [provider]  oxybutynin (DITROPAN) 5 MG tablet  03/15/15   [provider]    Allergies Evista [raloxifene]  Family History  Problem Relation Age of Onset  . Breast cancer Paternal Grandmother     Social History Social History   Tobacco Use  . Smoking status: Former Smoker    Packs/day: 1.00    Types: Cigarettes  . Smokeless tobacco: Never Used  Substance Use Topics  . Alcohol use: Yes    Frequency: Never    Comment: occ  . Drug use: No    Review of Systems Constitutional: No fever/chills Eyes: No visual changes. ENT: No  sore throat. Cardiovascular: Denies chest pain. Respiratory: Denies shortness of breath. Gastrointestinal: No abdominal pain.  No nausea, no vomiting.  No diarrhea.  No constipation. Genitourinary: Negative for dysuria. Musculoskeletal: Negative for back pain. Skin: Nasal edema and laceration. Neurological: Positive for headaches, but denies focal weakness or numbness. Allergic/Immunilogical: Evista ____________________________________________   PHYSICAL EXAM:  VITAL SIGNS: ED Triage Vitals [11/02/17 1234]  Enc Vitals Group     BP (!) 153/89     Pulse Rate 76      Resp 18     Temp 98.5 F (36.9 C)     Temp Source Oral     SpO2 98 %     Weight 143 lb (64.9 kg)     Height      Head Circumference      Peak Flow      Pain Score 1     Pain Loc      Pain Edu?      Excl. in GC?    Constitutional: Alert and oriented. Well appearing and in no acute distress. Eyes: Conjunctivae are normal. PERRL. EOMI. Head: Atraumatic. Nose: Nasal edema. Mouth/Throat: Mucous membranes are moist.  Oropharynx non-erythematous. Neck: No stridor.  Hematological/Lymphatic/Immunilogical: No cervical lymphadenopathy. Cardiovascular: Normal rate, regular rhythm. Grossly normal heart sounds.  Good peripheral circulation.  Elevated blood pressure Respiratory: Normal respiratory effort.  No retractions. Lungs CTAB. Gastrointestinal: Soft and nontender. No distention. No abdominal bruits. No CVA tenderness. Musculoskeletal: No lower extremity tenderness nor edema.  No joint effusions. Neurologic:  Normal speech and language. No gross focal neurologic deficits are appreciated. No gait instability. Skin: Superficial laceration anterior nasal area.  Nasal and left infraorbital ecchymosis. Psychiatric: Mood and affect are normal. Speech and behavior are normal.  ____________________________________________   LABS (all labs ordered are listed, but only abnormal results are displayed)  Labs Reviewed - No data to display ____________________________________________  EKG   ____________________________________________  RADIOLOGY  ED MD interpretation:    Official radiology report(s): Ct Head Wo Contrast  Result Date: 11/02/2017 CLINICAL DATA:  Fall. Rule out nasal fracture. Rule out head injury. EXAM: CT HEAD WITHOUT CONTRAST CT MAXILLOFACIAL WITHOUT CONTRAST TECHNIQUE: Multidetector CT imaging of the head and maxillofacial structures were performed using the standard protocol without intravenous contrast. Multiplanar CT image reconstructions of the maxillofacial structures  were also generated. COMPARISON:  None. FINDINGS: CT HEAD FINDINGS Brain: Mild to moderate atrophy. Mild chronic microvascular ischemic change in the white matter. Negative for hemorrhage, mass, or edema. No midline shift. Vascular: Negative for hyperdense vessel. Skull: Negative for skull fracture. Focal bony exostosis right anterior parietal bone projecting into the scalp has a benign appearance. Other: None CT MAXILLOFACIAL FINDINGS Osseous: Negative for fracture.  Nasal bone intact. Orbits: Normal orbital soft tissues. No mass lesion. Bilateral cataract removal. Sinuses: Mucosal edema and extensive bony thickening right ethmoid and maxillary sinuses due to chronic sinusitis. Chronic mucosal edema and bony thickening also in the right frontal sinus. Left-sided sinuses clear. Mastoid sinus and middle ear clear bilaterally. Soft tissues: Soft tissue swelling of the nose. IMPRESSION: 1. No acute intracranial abnormality. Mild atrophy and mild chronic microvascular ischemia 2. Negative for facial fracture 3. Chronic sinusitis on the right with extensive bony thickening right frontal, ethmoid, and maxillary sinus 4. Nasal contusion without nasal bone fracture Electronically Signed   By: Marlan Palau M.D.   On: 11/02/2017 13:22   Ct Maxillofacial Wo Contrast  Result Date: 11/02/2017 CLINICAL DATA:  Fall. Rule out nasal fracture. Rule  out head injury. EXAM: CT HEAD WITHOUT CONTRAST CT MAXILLOFACIAL WITHOUT CONTRAST TECHNIQUE: Multidetector CT imaging of the head and maxillofacial structures were performed using the standard protocol without intravenous contrast. Multiplanar CT image reconstructions of the maxillofacial structures were also generated. COMPARISON:  None. FINDINGS: CT HEAD FINDINGS Brain: Mild to moderate atrophy. Mild chronic microvascular ischemic change in the white matter. Negative for hemorrhage, mass, or edema. No midline shift. Vascular: Negative for hyperdense vessel. Skull: Negative for  skull fracture. Focal bony exostosis right anterior parietal bone projecting into the scalp has a benign appearance. Other: None CT MAXILLOFACIAL FINDINGS Osseous: Negative for fracture.  Nasal bone intact. Orbits: Normal orbital soft tissues. No mass lesion. Bilateral cataract removal. Sinuses: Mucosal edema and extensive bony thickening right ethmoid and maxillary sinuses due to chronic sinusitis. Chronic mucosal edema and bony thickening also in the right frontal sinus. Left-sided sinuses clear. Mastoid sinus and middle ear clear bilaterally. Soft tissues: Soft tissue swelling of the nose. IMPRESSION: 1. No acute intracranial abnormality. Mild atrophy and mild chronic microvascular ischemia 2. Negative for facial fracture 3. Chronic sinusitis on the right with extensive bony thickening right frontal, ethmoid, and maxillary sinus 4. Nasal contusion without nasal bone fracture Electronically Signed   By: Marlan Palau M.D.   On: 11/02/2017 13:22    ____________________________________________   PROCEDURES  Procedure(s) performed: None  .Marland KitchenLaceration Repair Date/Time: 11/02/2017 1:36 PM Performed by: Joni Reining, PA-C Authorized by: Joni Reining, PA-C   Consent:    Consent obtained:  Verbal   Consent given by:  Patient   Risks discussed:  Infection and poor wound healing Anesthesia (see MAR for exact dosages):    Anesthesia method:  None Laceration details:    Location:  Face   Face location:  Nose   Length (cm):  0.2 Repair type:    Repair type:  Simple Pre-procedure details:    Preparation:  Patient was prepped and draped in usual sterile fashion Exploration:    Hemostasis achieved with:  Direct pressure   Contaminated: no   Treatment:    Area cleansed with:  Betadine   Amount of cleaning:  Standard   Irrigation solution:  Sterile saline Skin repair:    Repair method:  Tissue adhesive Approximation:    Approximation:  Close Post-procedure details:    Dressing:  Open  (no dressing)   Patient tolerance of procedure:  Tolerated well, no immediate complications    Critical Care performed: No  ____________________________________________   INITIAL IMPRESSION / ASSESSMENT AND PLAN / ED COURSE  As part of my medical decision making, I reviewed the following data within the electronic MEDICAL RECORD NUMBER    Patient presents with nasal and facial contusion secondary to fall.  Patient resolved epistaxis prior to triage.  Patient reports no loss of conscious, blurry vision or vertigo.  Discussed no acute findings on CT of the head and maxillofacial areas.  Patient nasal laceration was cleaned and closed with Dermabond.  Patient given discharge care instruction.  Patient advised follow-up PCP as needed.  Return back to ED if condition worsens.      ____________________________________________   FINAL CLINICAL IMPRESSION(S) / ED DIAGNOSES  Final diagnoses:  Contusion of face, initial encounter  Fall in home, initial encounter  Mild epistaxis  Nasal laceration, initial encounter     ED Discharge Orders    None       Note:  This document was prepared using Dragon voice recognition software and may include unintentional  dictation errors.    Joni Reining, PA-C 11/02/17 1340    Don Perking, [place], MD 11/02/17 1537

## 2017-11-02 NOTE — Discharge Instructions (Addendum)
Follow discharge care instruction for nosebleed and Dermabond wound care.

## 2017-11-02 NOTE — ED Triage Notes (Addendum)
Pt to ed with c/o fall this am.  Pt states she got her foot caught in bedspread and fell face forward onto bathroom floor.  Pt with swelling and bruising noted to nose.   Pt denies loss of consciousness.  Pt alert and oriented at this time.

## 2017-11-02 NOTE — ED Notes (Signed)
See triage note  Got her foot caught and fell face first in bathroom  No LOC

## 2017-11-24 ENCOUNTER — Other Ambulatory Visit: Payer: Self-pay | Admitting: Family Medicine

## 2017-11-24 DIAGNOSIS — Z1231 Encounter for screening mammogram for malignant neoplasm of breast: Secondary | ICD-10-CM

## 2017-12-24 ENCOUNTER — Ambulatory Visit
Admission: RE | Admit: 2017-12-24 | Discharge: 2017-12-24 | Disposition: A | Discharge status: Home / Self Care | Payer: Medicare HMO | Source: Ambulatory Visit | Attending: Family Medicine | Admitting: Family Medicine

## 2017-12-24 DIAGNOSIS — Z1231 Encounter for screening mammogram for malignant neoplasm of breast: Secondary | ICD-10-CM | POA: Insufficient documentation

## 2018-02-19 ENCOUNTER — Ambulatory Visit: Payer: Medicare HMO | Admitting: Podiatry

## 2018-02-19 DIAGNOSIS — L851 Acquired keratosis [keratoderma] palmaris et plantaris: Secondary | ICD-10-CM

## 2018-02-19 DIAGNOSIS — L84 Corns and callosities: Secondary | ICD-10-CM

## 2018-02-22 NOTE — Progress Notes (Signed)
Patient ID: Melissa [name], female   DOB: April 05, 1928, 84 y.o.   MRN: [id number]   Subjective:  84 year old female presenting today for follow up evaluation of a callus lesion of the left foot. She states she feels like there is tape on the foot. She has not done anything for treatment at home. There are no modifying factors noted. Patient is here for further evaluation and treatment.   Past Medical History:  Diagnosis Date  . Arthritis   . Coronary artery disease   . Glaucoma (increased eye pressure)   . Hx of urinary frequency   . Hypothyroidism   . Macular degeneration of both eyes       Objective/Physical Exam General: The patient is alert and oriented x3 in no acute distress.  Dermatology: Hyperkeratotic callus lesions noted to the plantar aspect of the left foot. Asymptomatic. Skin is warm, dry and supple bilateral lower extremities. Negative for open lesions or macerations.  Vascular: Palpable pedal pulses bilaterally. No edema or erythema noted. Capillary refill within normal limits.  Neurological: Epicritic and protective threshold grossly intact bilaterally.   Musculoskeletal Exam: Range of motion within normal limits to all pedal and ankle joints bilateral. Muscle strength 5/5 in all groups bilateral.    Assessment: #1 callus plantar aspect of the left foot #2 history of hammertoe surgery second digit left foot   Plan of Care:  #1 Patient was evaluated. #2 inform the patient that she does not have tape on the bottom of her left foot. #3 today we discussed the callus formation on the plantar aspect of her left foot. Calluses are nonsymptomatic. #4 patient also gets random foot pain regarding her surgery to the second digit hammertoe. Recommend offload padding. Today pads were dispensed #5 return to clinic when necessary   Felecia ShellingBrent M. Fizza Scales, DPM [facility]  Dr. Felecia ShellingBrent M. Talibah Colasurdo, DPM    [address]                                          [place], [place] [address]                Office [phone]  Fax [phone]

## 2018-08-16 ENCOUNTER — Encounter: Payer: Self-pay | Admitting: Podiatry

## 2018-08-16 ENCOUNTER — Ambulatory Visit: Payer: Medicare HMO | Admitting: Podiatry

## 2018-08-16 ENCOUNTER — Ambulatory Visit (INDEPENDENT_AMBULATORY_CARE_PROVIDER_SITE_OTHER): Payer: Medicare HMO

## 2018-08-16 DIAGNOSIS — S90122A Contusion of left lesser toe(s) without damage to nail, initial encounter: Secondary | ICD-10-CM | POA: Diagnosis not present

## 2018-08-16 DIAGNOSIS — Q828 Other specified congenital malformations of skin: Secondary | ICD-10-CM | POA: Diagnosis not present

## 2018-08-16 NOTE — Progress Notes (Signed)
She presents today chief complaint of painful lesion plantar aspect of the forefoot left.  She would like to have a x-ray done because of her injury to the toe second left.  States that Dr. Pricilla Holmucker and several years ago did surgery.  Objective: Vital signs are stable alert and oriented x3.  Pulses are palpable.  Neurologic sensorium is intact.  Degenerative flexors are intact.  She has a plantar reactive hyperkeratotic lesion plantar aspect of the forefoot second metatarsal area left.  The toe is rectus in good position is limited on this range of motion secondary to osteoarthritic changes at that joint.  No other acute findings are noted on radiograph.  Assessment: Porokeratosis subsecond metatarsal phalangeal joint left.  Osteoarthritis second metatarsophalangeal joint left.  Plan: Debridement of the lesion today placed padding.  Discussed a six-month follow-up.

## 2019-02-16 ENCOUNTER — Other Ambulatory Visit: Payer: Self-pay | Admitting: Family Medicine

## 2019-02-16 ENCOUNTER — Other Ambulatory Visit: Payer: Self-pay | Admitting: Internal Medicine

## 2019-02-16 DIAGNOSIS — Z1231 Encounter for screening mammogram for malignant neoplasm of breast: Secondary | ICD-10-CM

## 2019-03-09 IMAGING — CT CT HEAD W/O CM
3 of 7 series · 15 of 47 positions shown, 18 images · non-contrast
Comparison: None.

CLINICAL DATA: Fall. Rule out nasal fracture. Rule out head injury.

EXAM:
CT HEAD WITHOUT CONTRAST
CT MAXILLOFACIAL WITHOUT CONTRAST
TECHNIQUE: Multidetector CT imaging of the head and maxillofacial structures
were performed using the standard protocol without intravenous
contrast. Multiplanar CT image reconstructions of the maxillofacial
structures were also generated.

[Series 5: max soft · axial · 0.32mm/px · z∈[-236,-92]mm · 11 of 80 slices shown, 14 images]
[im 4/80  brain]
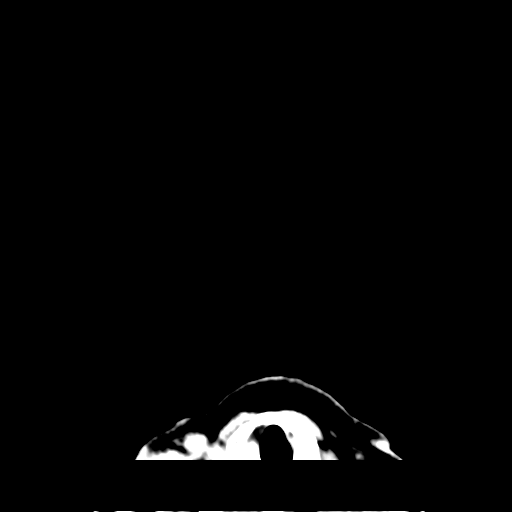
[im 4/80  bone]
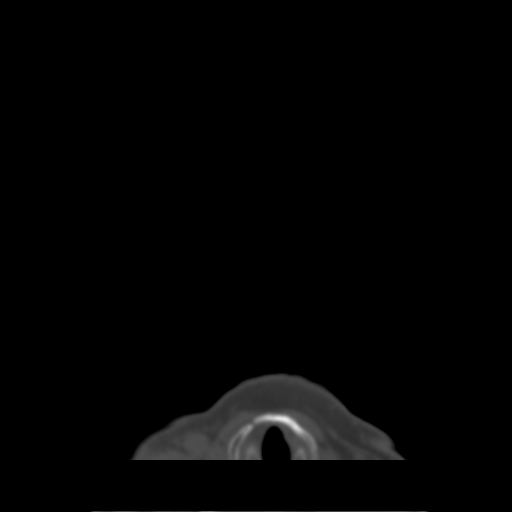
[im 12/80  brain]
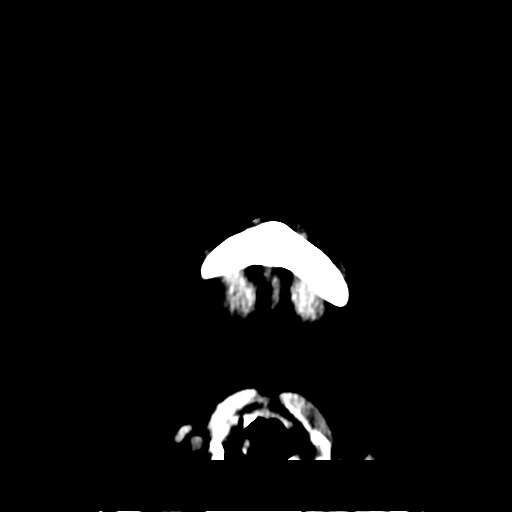
[im 19/80  brain]
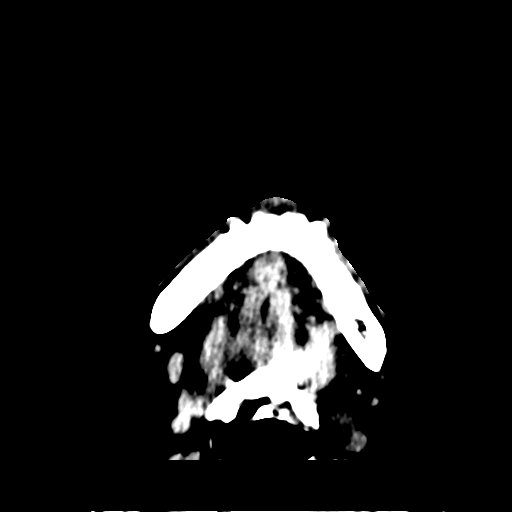
[im 27/80  brain]
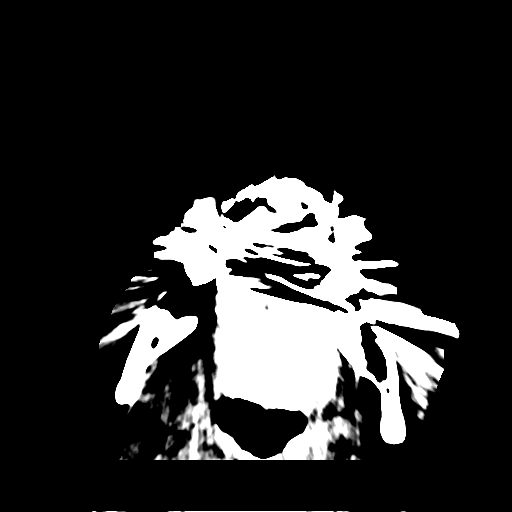
[im 34/80  brain]
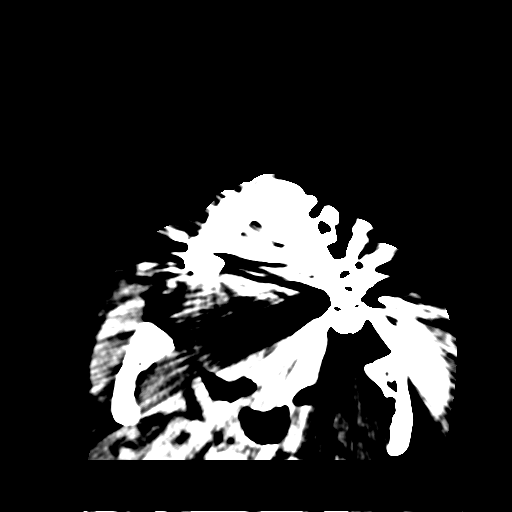
[im 34/80  bone]
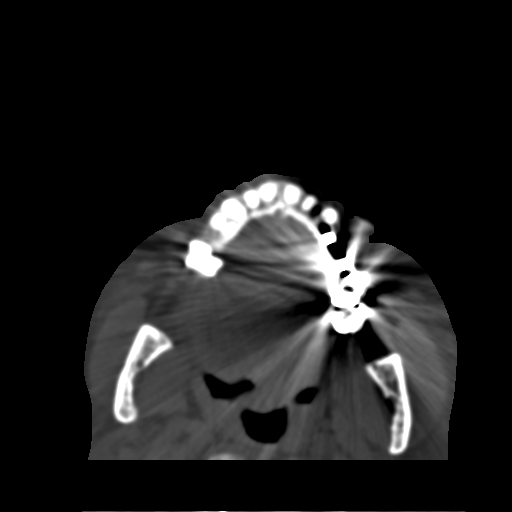
[im 42/80  brain]
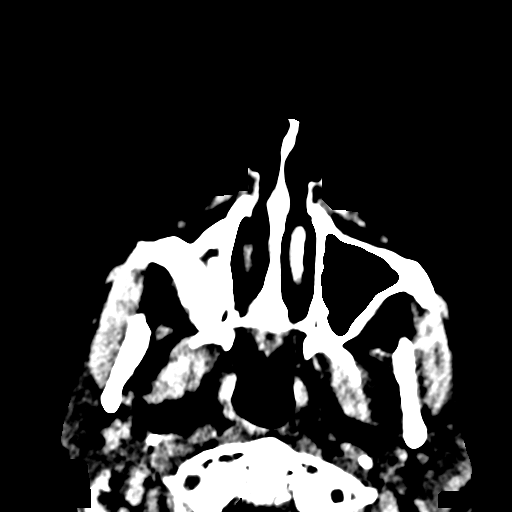
[im 46/80  brain]
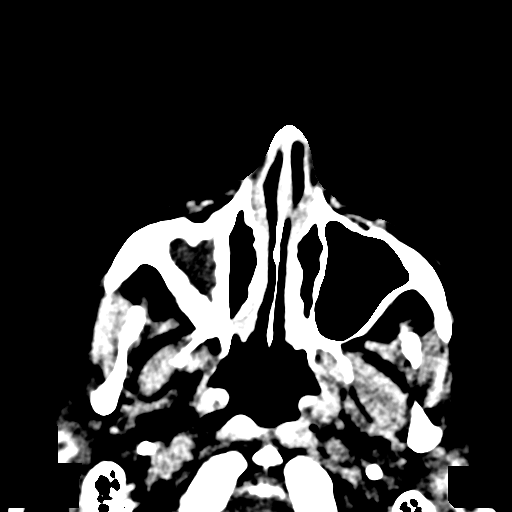
[im 53/80  brain]
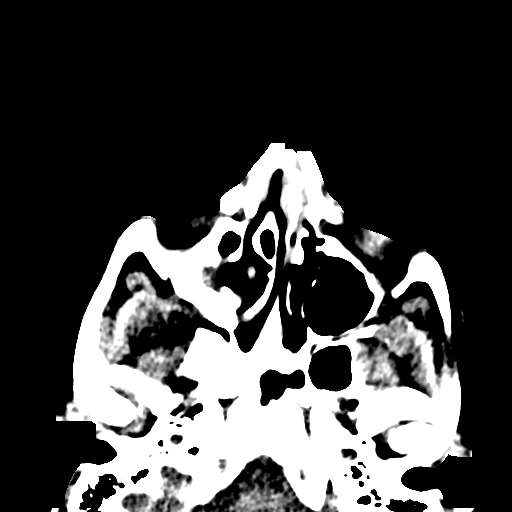
[im 61/80  brain]
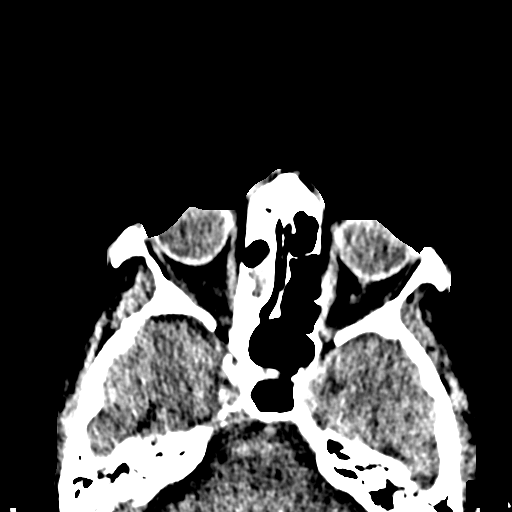
[im 61/80  bone]
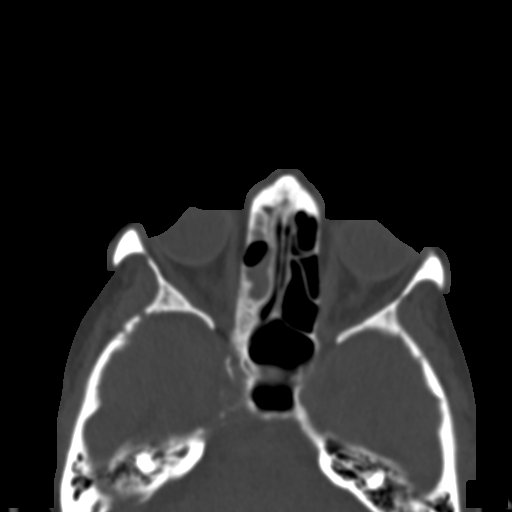
[im 68/80  brain]
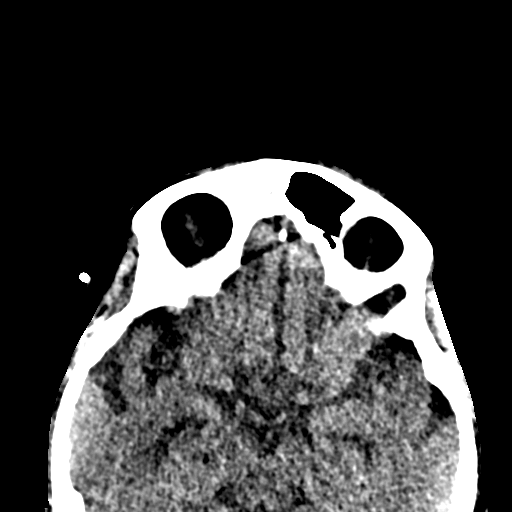
[im 76/80  brain]
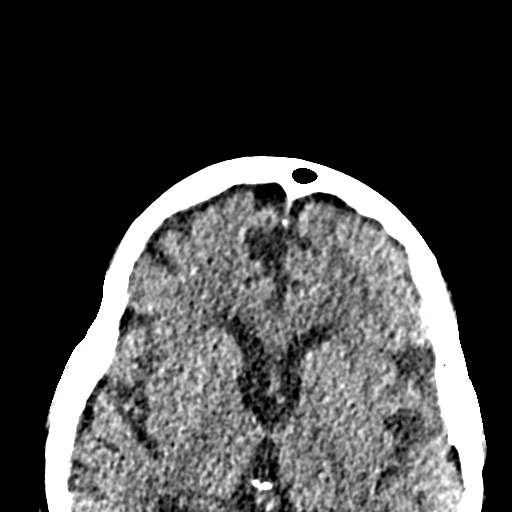

[Series 6: coronal soft tissue · coronal · 0.28mm/px · 3 of 65 slices shown]
[im 19/65  brain]
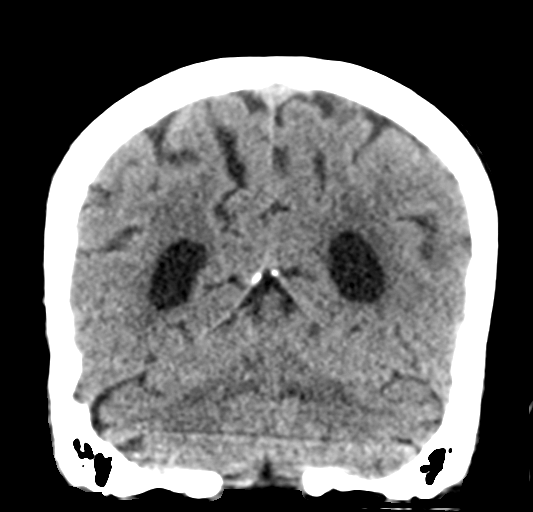
[im 38/65  brain]
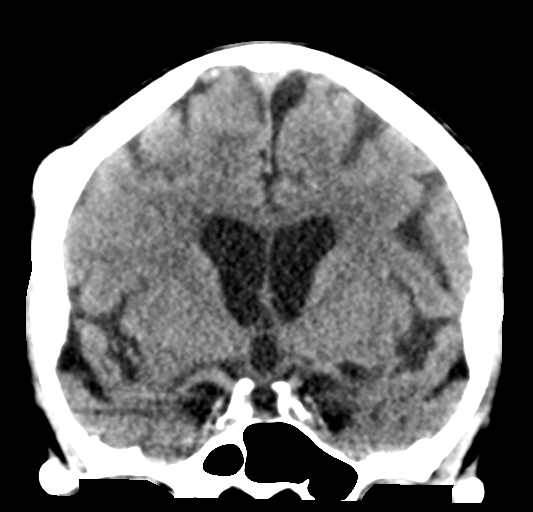
[im 57/65  brain]
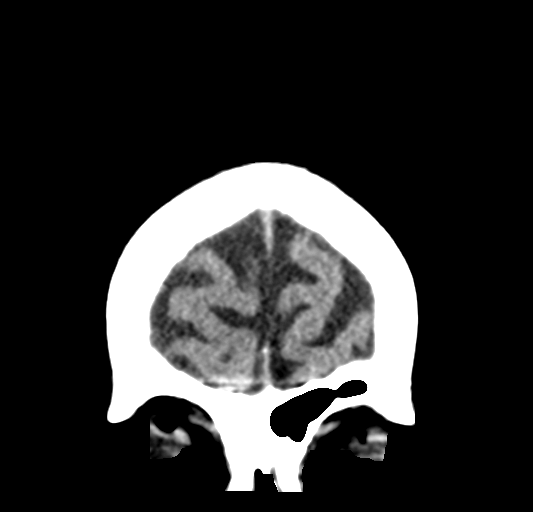

[Series 12: sagittal soft · sagittal · 0.27mm/px · 1 of 83 slices shown]
[im 42/83  brain]
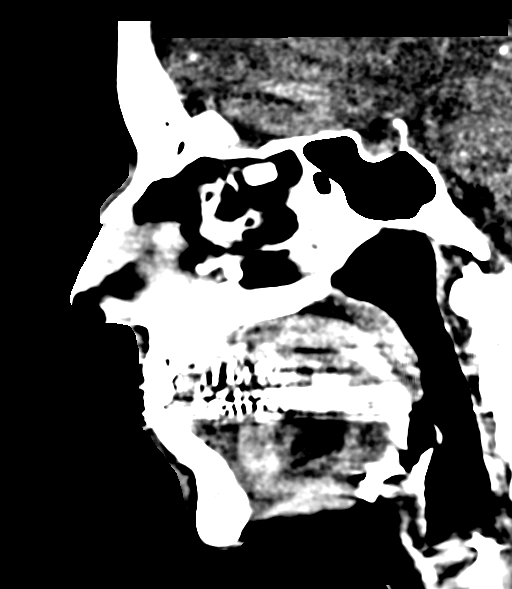

[15 of 47 positions shown; findings below may reference images not displayed]

FINDINGS: CT HEAD FINDINGS

Brain: Mild to moderate atrophy. Mild chronic microvascular ischemic
change in the white matter. Negative for hemorrhage, mass, or edema.
No midline shift.

Vascular: Negative for hyperdense vessel.

Skull: Negative for skull fracture. Focal bony exostosis right
anterior parietal bone projecting into the scalp has a benign
appearance.

Other: None

CT MAXILLOFACIAL FINDINGS

Osseous: Negative for fracture.  Nasal bone intact.

Orbits: Normal orbital soft tissues. No mass lesion. Bilateral
cataract removal.

Sinuses: Mucosal edema and extensive bony thickening right ethmoid
and maxillary sinuses due to chronic sinusitis. Chronic mucosal
edema and bony thickening also in the right frontal sinus.
Left-sided sinuses clear. Mastoid sinus and middle ear clear
bilaterally.

Soft tissues: Soft tissue swelling of the nose.
IMPRESSION: 1. No acute intracranial abnormality. Mild atrophy and mild chronic
microvascular ischemia
2. Negative for facial fracture
3. Chronic sinusitis on the right with extensive bony thickening
right frontal, ethmoid, and maxillary sinus
4. Nasal contusion without nasal bone fracture

## 2019-03-25 ENCOUNTER — Ambulatory Visit
Admission: RE | Admit: 2019-03-25 | Discharge: 2019-03-25 | Disposition: A | Discharge status: Home / Self Care | Payer: Medicare HMO | Source: Ambulatory Visit | Attending: Internal Medicine | Admitting: Internal Medicine

## 2019-03-25 ENCOUNTER — Other Ambulatory Visit: Payer: Self-pay

## 2019-03-25 DIAGNOSIS — Z1231 Encounter for screening mammogram for malignant neoplasm of breast: Secondary | ICD-10-CM

## 2020-02-17 ENCOUNTER — Other Ambulatory Visit: Payer: Self-pay | Admitting: Internal Medicine

## 2020-02-17 DIAGNOSIS — Z1231 Encounter for screening mammogram for malignant neoplasm of breast: Secondary | ICD-10-CM

## 2020-03-26 ENCOUNTER — Other Ambulatory Visit: Payer: Self-pay

## 2020-03-26 ENCOUNTER — Ambulatory Visit
Admission: RE | Admit: 2020-03-26 | Discharge: 2020-03-26 | Disposition: A | Discharge status: Home / Self Care | Payer: Medicare HMO | Source: Ambulatory Visit | Attending: Internal Medicine | Admitting: Internal Medicine

## 2020-03-26 DIAGNOSIS — Z1231 Encounter for screening mammogram for malignant neoplasm of breast: Secondary | ICD-10-CM

## 2021-03-08 ENCOUNTER — Other Ambulatory Visit: Payer: Self-pay | Admitting: Internal Medicine

## 2021-03-08 DIAGNOSIS — Z1231 Encounter for screening mammogram for malignant neoplasm of breast: Secondary | ICD-10-CM

## 2021-03-28 ENCOUNTER — Ambulatory Visit
Admission: RE | Admit: 2021-03-28 | Discharge: 2021-03-28 | Disposition: A | Discharge status: Home / Self Care | Payer: Medicare Other | Source: Ambulatory Visit | Attending: Internal Medicine | Admitting: Internal Medicine

## 2021-03-28 ENCOUNTER — Other Ambulatory Visit: Payer: Self-pay

## 2021-03-28 DIAGNOSIS — Z1231 Encounter for screening mammogram for malignant neoplasm of breast: Secondary | ICD-10-CM | POA: Diagnosis not present

## 2022-02-17 ENCOUNTER — Other Ambulatory Visit: Payer: Self-pay | Admitting: Internal Medicine

## 2022-02-17 DIAGNOSIS — Z1231 Encounter for screening mammogram for malignant neoplasm of breast: Secondary | ICD-10-CM

## 2022-03-31 ENCOUNTER — Ambulatory Visit
Admission: RE | Admit: 2022-03-31 | Discharge: 2022-03-31 | Disposition: A | Discharge status: Home / Self Care | Payer: Medicare Other | Source: Ambulatory Visit | Attending: Internal Medicine | Admitting: Internal Medicine

## 2022-03-31 DIAGNOSIS — Z1231 Encounter for screening mammogram for malignant neoplasm of breast: Secondary | ICD-10-CM | POA: Diagnosis present

## 2023-02-24 ENCOUNTER — Encounter: Payer: Self-pay | Admitting: Emergency Medicine

## 2023-02-24 ENCOUNTER — Emergency Department: Payer: Medicare Other

## 2023-02-24 ENCOUNTER — Emergency Department
Admission: EM | Admit: 2023-02-24 | Discharge: 2023-02-24 | Disposition: A | Discharge status: Home / Self Care | Payer: Medicare Other | Attending: Emergency Medicine | Admitting: Emergency Medicine

## 2023-02-24 ENCOUNTER — Other Ambulatory Visit: Payer: Self-pay

## 2023-02-24 DIAGNOSIS — K5730 Diverticulosis of large intestine without perforation or abscess without bleeding: Secondary | ICD-10-CM | POA: Insufficient documentation

## 2023-02-24 DIAGNOSIS — E785 Hyperlipidemia, unspecified: Secondary | ICD-10-CM | POA: Insufficient documentation

## 2023-02-24 DIAGNOSIS — J449 Chronic obstructive pulmonary disease, unspecified: Secondary | ICD-10-CM | POA: Insufficient documentation

## 2023-02-24 DIAGNOSIS — I2510 Atherosclerotic heart disease of native coronary artery without angina pectoris: Secondary | ICD-10-CM | POA: Diagnosis not present

## 2023-02-24 DIAGNOSIS — R0789 Other chest pain: Secondary | ICD-10-CM | POA: Insufficient documentation

## 2023-02-24 DIAGNOSIS — M7989 Other specified soft tissue disorders: Secondary | ICD-10-CM | POA: Insufficient documentation

## 2023-02-24 DIAGNOSIS — M4854XA Collapsed vertebra, not elsewhere classified, thoracic region, initial encounter for fracture: Secondary | ICD-10-CM | POA: Diagnosis not present

## 2023-02-24 DIAGNOSIS — I700 Atherosclerosis of aorta: Secondary | ICD-10-CM | POA: Diagnosis not present

## 2023-02-24 DIAGNOSIS — I10 Essential (primary) hypertension: Secondary | ICD-10-CM | POA: Insufficient documentation

## 2023-02-24 DIAGNOSIS — R079 Chest pain, unspecified: Secondary | ICD-10-CM

## 2023-02-24 LAB — BASIC METABOLIC PANEL
Anion gap: 8 (ref 5–15)
BUN: 11 mg/dL (ref 8–23)
CO2: 25 mmol/L (ref 22–32)
Calcium: 8.9 mg/dL (ref 8.9–10.3)
Chloride: 104 mmol/L (ref 98–111)
Creatinine, Ser: 0.61 mg/dL (ref 0.44–1.00)
GFR, Estimated: >60 mL/min (ref >60)
Glucose, Bld: 88 mg/dL (ref 70–99)
Potassium: 3.6 mmol/L (ref 3.5–5.1)
Sodium: 137 mmol/L (ref 135–145)

## 2023-02-24 LAB — HEPATIC FUNCTION PANEL
ALT: 16 U/L (ref 0–44)
AST: 26 U/L (ref 15–41)
Albumin: 3.7 g/dL (ref 3.5–5.0)
Alkaline Phosphatase: 75 U/L (ref 38–126)
Bilirubin, Direct: 0.1 mg/dL (ref 0.0–0.2)
Indirect Bilirubin: 0.7 mg/dL (ref 0.3–0.9)
Total Bilirubin: 0.8 mg/dL (ref 0.3–1.2)
Total Protein: 6.8 g/dL (ref 6.5–8.1)

## 2023-02-24 LAB — CBC
HCT: 36.4 % (ref 36.0–46.0)
Hemoglobin: 12.6 g/dL (ref 12.0–15.0)
MCH: 32 pg (ref 26.0–34.0)
MCHC: 34.6 g/dL (ref 30.0–36.0)
MCV: 92.4 fL (ref 80.0–100.0)
Platelets: 196 10*3/uL (ref 150–400)
RBC: 3.94 MIL/uL (ref 3.87–5.11)
RDW: 13.5 % (ref 11.5–15.5)
WBC: 4.9 10*3/uL (ref 4.0–10.5)
nRBC: 0 % (ref 0.0–0.2)

## 2023-02-24 LAB — TROPONIN I (HIGH SENSITIVITY)
Troponin I (High Sensitivity): 5 ng/L (ref <18)
Troponin I (High Sensitivity): 5 ng/L (ref <18)

## 2023-02-24 MED ORDER — IOHEXOL 350 MG/ML SOLN
100.0000 mL | Freq: Once | INTRAVENOUS | Status: AC | PRN
  Administered 2023-02-24: 100 mL via INTRAVENOUS

## 2023-02-24 NOTE — Discharge Instructions (Signed)
You were seen in the emergency department today for your chest pain.  Your studies were fortunately reassuring.  Your CT scan did show that you have a soft tissue mass near your hip.  This has actually been on imaging that we can review back to 2008, but has grown slightly.  Please arrange follow-up with your primary care doctor and cardiologist for further evaluation.  Return to the ER for new or worsening symptoms.

## 2023-02-24 NOTE — ED Notes (Signed)
See triage notes. Patient c/o left sided chest pain

## 2023-02-24 NOTE — ED Triage Notes (Signed)
Patient to ED via POV for chest pain- left side under breast. Describes pain as achy 6/10. Hx of heart cath- "many years ago."

## 2023-02-24 NOTE — ED Provider Notes (Signed)
[facility] Provider Note    Event Date/Time   First MD Initiated Contact with Patient 02/24/23 1059     (approximate)   History   Chest Pain   HPI  Melissa [name] is a 89 y.o. female  with history of CAD, HTN, HLD presenting to the emergency department for evaluation of chest pain.  Patient woke up at her normal time this morning and noted that she had some discomfort under the left side of her breast.  Denies significant shortness of breath.  Reports some mild ongoing discomfort, significantly improved.  Denies radiation or pressure sensation.  Denies history of similar.  No identifiable recent trauma or increased exertion.  She additionally reports that a staff member at her facility noticed that she has a large area of swelling.  Patient denies trauma.  She has been ambulatory as normal.  She takes a baby aspirin, no other anticoagulation.  Denies tenderness over the area.      Physical Exam   Triage Vital Signs: ED Triage Vitals [02/24/23 0854]  Encounter Vitals Group     BP (!) 169/75     Systolic BP Percentile      Diastolic BP Percentile      Pulse Rate 67     Resp 18     Temp 98 F (36.7 C)     Temp Source Oral     SpO2 100 %     Weight      Height      Head Circumference      Peak Flow      Pain Score 6     Pain Loc      Pain Education      Exclude from Growth Chart     Most recent vital signs: Vitals:   02/24/23 0854  BP: (!) 169/75  Pulse: 67  Resp: 18  Temp: 98 F (36.7 C)  SpO2: 100%     General: Awake, interactive  CV:  Regular rate, good peripheral perfusion.  Chest wall: There is tenderness along the left lower chest wall into the left upper quadrant reproducible to palpation Resp:  Lungs clear, unlabored respirations.  Abd:  Soft, nondistended.  Neuro:  Symmetric facial movement, fluid speech MSK:  There is a large area of firm, nonmobile swelling without associated fluctuance, erythema, warmth over the  left proximal femur without open areas of skin   ED Results / Procedures / Treatments   Labs (all labs ordered are listed, but only abnormal results are displayed) Labs Reviewed  BASIC METABOLIC PANEL  CBC  HEPATIC FUNCTION PANEL  TROPONIN I (HIGH SENSITIVITY)  TROPONIN I (HIGH SENSITIVITY)     EKG EKG independently reviewed interpreted by myself (ER attending) demonstrates:  EKG demonstrates normal sinus rhythm at a rate of 60, PR 174, QRS 80, QTc 398, no acute ST changes  RADIOLOGY Imaging independently reviewed and interpreted by myself demonstrates:  CT of the chest without evidence of PE CT abdomen pelvis without splenic injury or other acute intra-abdominal process,Soft tissue mass noted on exam demonstrated on CT, but radiology notes this is actually been present on imaging going back to at least 2008 and only slightly increased in size.  PROCEDURES:  Critical Care performed: No  Procedures   MEDICATIONS ORDERED IN ED: Medications  iohexol (OMNIPAQUE) 350 MG/ML injection 100 mL (100 mLs Intravenous Contrast Given 02/24/23 1154)     IMPRESSION / MDM / ASSESSMENT AND PLAN / ED COURSE  I  reviewed the triage vital signs and the nursing notes.  Differential diagnosis includes, but is not limited to, ACS, PE, musculoskeletal strain, pneumothorax, pneumonia, regarding left proximal femur swelling, possible benign mass, hematoma, lower suspicion abscess, bony abnormality  Patient's presentation is most consistent with acute presentation with potential threat to life or bodily function.  89 year old female presenting to the emergency department for evaluation of chest pain, also reports a new left proximal femur swelling.  Initial labs from triage are reassuring.  Will obtain repeat troponin, CTA chest, CT abdomen pelvis to further evaluate for her chest pain and hip swelling.  Repeat troponin remains normal.   CT imaging without evidence of PE or other acute process.  Did  discuss soft tissue imaging noted on her CT scan, but did review that this is actually not new.  Patient does report that she has had some weight loss and thinks that is why it may have been more prominent recently.  Overall low suspicion for significant acute pathology based on her workup here.  Suspect likely musculoskeletal etiology given reproducible tenderness on exam.  She does have follow-up with cardiology arranged and will arrange follow-up with her primary care doctor.  Do think she is stable for discharge home.  Patient comfortable this plan.  Strict return precautions provided.     FINAL CLINICAL IMPRESSION(S) / ED DIAGNOSES   Final diagnoses:  Nonspecific chest pain  Mass of soft tissue of hip     Rx / DC Orders   ED Discharge Orders     None        Note:  This document was prepared using Dragon voice recognition software and may include unintentional dictation errors.   Trinna Post, MD 02/24/23 [phone]

## 2023-03-11 ENCOUNTER — Other Ambulatory Visit: Payer: Self-pay | Admitting: Internal Medicine

## 2023-03-11 DIAGNOSIS — Z1231 Encounter for screening mammogram for malignant neoplasm of breast: Secondary | ICD-10-CM

## 2023-04-14 ENCOUNTER — Other Ambulatory Visit: Payer: Self-pay | Admitting: Surgery

## 2023-04-14 ENCOUNTER — Ambulatory Visit
Admission: RE | Admit: 2023-04-14 | Discharge: 2023-04-14 | Disposition: A | Discharge status: Home / Self Care | Payer: Medicare Other | Source: Ambulatory Visit | Attending: Internal Medicine | Admitting: Internal Medicine

## 2023-04-14 DIAGNOSIS — R2242 Localized swelling, mass and lump, left lower limb: Secondary | ICD-10-CM

## 2023-04-14 DIAGNOSIS — Z1231 Encounter for screening mammogram for malignant neoplasm of breast: Secondary | ICD-10-CM | POA: Diagnosis present

## 2023-05-11 ENCOUNTER — Ambulatory Visit
Admission: RE | Admit: 2023-05-11 | Discharge: 2023-05-11 | Disposition: A | Discharge status: Home / Self Care | Payer: Medicare Other | Source: Ambulatory Visit | Attending: Surgery | Admitting: Surgery

## 2023-05-11 DIAGNOSIS — R2242 Localized swelling, mass and lump, left lower limb: Secondary | ICD-10-CM | POA: Insufficient documentation

## 2023-05-11 MED ORDER — GADOBUTROL 1 MMOL/ML IV SOLN
6.0000 mL | Freq: Once | INTRAVENOUS | Status: AC | PRN
  Administered 2023-05-11: 6 mL via INTRAVENOUS

## 2024-03-08 ENCOUNTER — Other Ambulatory Visit: Payer: Self-pay | Admitting: Internal Medicine

## 2024-03-08 DIAGNOSIS — Z1231 Encounter for screening mammogram for malignant neoplasm of breast: Secondary | ICD-10-CM

## 2024-04-19 ENCOUNTER — Ambulatory Visit
Admission: RE | Admit: 2024-04-19 | Discharge: 2024-04-19 | Disposition: A | Discharge status: Home / Self Care | Source: Ambulatory Visit | Attending: Internal Medicine | Admitting: Internal Medicine

## 2024-04-19 DIAGNOSIS — Z1231 Encounter for screening mammogram for malignant neoplasm of breast: Secondary | ICD-10-CM | POA: Insufficient documentation
